# Patient Record
Sex: Female | Born: 1945 | Hispanic: No | Marital: Married | Smoking: Never smoker
Health system: Southern US, Community
[De-identification: ages and names within clinical notes are randomized; demographics above are authoritative.]

## PROBLEM LIST (undated history)

## (undated) DIAGNOSIS — M1710 Unilateral primary osteoarthritis, unspecified knee: Secondary | ICD-10-CM

## (undated) DIAGNOSIS — F419 Anxiety disorder, unspecified: Secondary | ICD-10-CM

## (undated) DIAGNOSIS — K219 Gastro-esophageal reflux disease without esophagitis: Secondary | ICD-10-CM

## (undated) DIAGNOSIS — E785 Hyperlipidemia, unspecified: Secondary | ICD-10-CM

## (undated) HISTORY — DX: Gastro-esophageal reflux disease without esophagitis: K21.9

## (undated) HISTORY — DX: Unilateral primary osteoarthritis, unspecified knee: M17.10

## (undated) HISTORY — DX: Anxiety disorder, unspecified: F41.9

## (undated) HISTORY — DX: Hyperlipidemia, unspecified: E78.5

---

## 2013-02-09 DIAGNOSIS — Z8541 Personal history of malignant neoplasm of cervix uteri: Secondary | ICD-10-CM | POA: Insufficient documentation

## 2013-08-17 DIAGNOSIS — R300 Dysuria: Secondary | ICD-10-CM | POA: Diagnosis not present

## 2013-08-18 DIAGNOSIS — Z79890 Hormone replacement therapy (postmenopausal): Secondary | ICD-10-CM | POA: Insufficient documentation

## 2013-08-18 DIAGNOSIS — Z0000 Encounter for general adult medical examination without abnormal findings: Secondary | ICD-10-CM | POA: Diagnosis not present

## 2013-08-18 DIAGNOSIS — F411 Generalized anxiety disorder: Secondary | ICD-10-CM | POA: Diagnosis not present

## 2013-08-18 DIAGNOSIS — R300 Dysuria: Secondary | ICD-10-CM | POA: Diagnosis not present

## 2014-02-21 DIAGNOSIS — E790 Hyperuricemia without signs of inflammatory arthritis and tophaceous disease: Secondary | ICD-10-CM | POA: Diagnosis not present

## 2014-02-21 DIAGNOSIS — Z79899 Other long term (current) drug therapy: Secondary | ICD-10-CM | POA: Diagnosis not present

## 2014-02-21 DIAGNOSIS — E785 Hyperlipidemia, unspecified: Secondary | ICD-10-CM | POA: Diagnosis not present

## 2014-03-02 DIAGNOSIS — Z79890 Hormone replacement therapy (postmenopausal): Secondary | ICD-10-CM | POA: Diagnosis not present

## 2014-03-02 DIAGNOSIS — Z23 Encounter for immunization: Secondary | ICD-10-CM | POA: Diagnosis not present

## 2014-03-02 DIAGNOSIS — F411 Generalized anxiety disorder: Secondary | ICD-10-CM | POA: Diagnosis not present

## 2014-03-02 DIAGNOSIS — E785 Hyperlipidemia, unspecified: Secondary | ICD-10-CM | POA: Diagnosis not present

## 2014-03-02 DIAGNOSIS — R7301 Impaired fasting glucose: Secondary | ICD-10-CM | POA: Diagnosis not present

## 2014-05-30 DIAGNOSIS — C8588 Other specified types of non-Hodgkin lymphoma, lymph nodes of multiple sites: Secondary | ICD-10-CM | POA: Diagnosis not present

## 2014-05-30 DIAGNOSIS — Z79899 Other long term (current) drug therapy: Secondary | ICD-10-CM | POA: Diagnosis not present

## 2014-05-30 DIAGNOSIS — E785 Hyperlipidemia, unspecified: Secondary | ICD-10-CM | POA: Diagnosis not present

## 2014-05-31 DIAGNOSIS — E785 Hyperlipidemia, unspecified: Secondary | ICD-10-CM | POA: Diagnosis not present

## 2014-05-31 DIAGNOSIS — F411 Generalized anxiety disorder: Secondary | ICD-10-CM | POA: Diagnosis not present

## 2014-05-31 DIAGNOSIS — R7301 Impaired fasting glucose: Secondary | ICD-10-CM | POA: Diagnosis not present

## 2014-09-04 DIAGNOSIS — R7301 Impaired fasting glucose: Secondary | ICD-10-CM | POA: Diagnosis not present

## 2014-09-04 DIAGNOSIS — F411 Generalized anxiety disorder: Secondary | ICD-10-CM | POA: Diagnosis not present

## 2014-09-04 DIAGNOSIS — K219 Gastro-esophageal reflux disease without esophagitis: Secondary | ICD-10-CM | POA: Diagnosis not present

## 2014-09-04 DIAGNOSIS — E559 Vitamin D deficiency, unspecified: Secondary | ICD-10-CM | POA: Diagnosis not present

## 2014-09-04 DIAGNOSIS — E538 Deficiency of other specified B group vitamins: Secondary | ICD-10-CM | POA: Diagnosis not present

## 2014-09-04 DIAGNOSIS — Z79890 Hormone replacement therapy (postmenopausal): Secondary | ICD-10-CM | POA: Diagnosis not present

## 2014-09-04 DIAGNOSIS — E785 Hyperlipidemia, unspecified: Secondary | ICD-10-CM | POA: Diagnosis not present

## 2014-09-04 DIAGNOSIS — Z0000 Encounter for general adult medical examination without abnormal findings: Secondary | ICD-10-CM | POA: Diagnosis not present

## 2014-09-14 DIAGNOSIS — E559 Vitamin D deficiency, unspecified: Secondary | ICD-10-CM | POA: Diagnosis not present

## 2014-09-14 DIAGNOSIS — R7301 Impaired fasting glucose: Secondary | ICD-10-CM | POA: Diagnosis not present

## 2014-09-14 DIAGNOSIS — Z79899 Other long term (current) drug therapy: Secondary | ICD-10-CM | POA: Diagnosis not present

## 2014-09-14 DIAGNOSIS — Z0000 Encounter for general adult medical examination without abnormal findings: Secondary | ICD-10-CM | POA: Diagnosis not present

## 2014-09-14 DIAGNOSIS — E785 Hyperlipidemia, unspecified: Secondary | ICD-10-CM | POA: Diagnosis not present

## 2014-09-14 DIAGNOSIS — E538 Deficiency of other specified B group vitamins: Secondary | ICD-10-CM | POA: Diagnosis not present

## 2014-09-21 DIAGNOSIS — M899 Disorder of bone, unspecified: Secondary | ICD-10-CM | POA: Diagnosis not present

## 2014-09-21 DIAGNOSIS — Z1231 Encounter for screening mammogram for malignant neoplasm of breast: Secondary | ICD-10-CM | POA: Diagnosis not present

## 2014-09-21 DIAGNOSIS — M8588 Other specified disorders of bone density and structure, other site: Secondary | ICD-10-CM | POA: Diagnosis not present

## 2014-09-27 DIAGNOSIS — N6011 Diffuse cystic mastopathy of right breast: Secondary | ICD-10-CM | POA: Diagnosis not present

## 2014-09-27 DIAGNOSIS — R921 Mammographic calcification found on diagnostic imaging of breast: Secondary | ICD-10-CM | POA: Diagnosis not present

## 2014-09-27 DIAGNOSIS — N6001 Solitary cyst of right breast: Secondary | ICD-10-CM | POA: Diagnosis not present

## 2014-12-28 DIAGNOSIS — R300 Dysuria: Secondary | ICD-10-CM | POA: Diagnosis not present

## 2014-12-28 DIAGNOSIS — F419 Anxiety disorder, unspecified: Secondary | ICD-10-CM | POA: Diagnosis not present

## 2014-12-28 DIAGNOSIS — N3001 Acute cystitis with hematuria: Secondary | ICD-10-CM | POA: Diagnosis not present

## 2015-03-07 DIAGNOSIS — E559 Vitamin D deficiency, unspecified: Secondary | ICD-10-CM | POA: Diagnosis not present

## 2015-03-07 DIAGNOSIS — E785 Hyperlipidemia, unspecified: Secondary | ICD-10-CM | POA: Diagnosis not present

## 2015-03-07 DIAGNOSIS — R7301 Impaired fasting glucose: Secondary | ICD-10-CM | POA: Diagnosis not present

## 2015-03-12 DIAGNOSIS — Z79890 Hormone replacement therapy (postmenopausal): Secondary | ICD-10-CM | POA: Diagnosis not present

## 2015-03-12 DIAGNOSIS — E785 Hyperlipidemia, unspecified: Secondary | ICD-10-CM | POA: Diagnosis not present

## 2015-03-12 DIAGNOSIS — F419 Anxiety disorder, unspecified: Secondary | ICD-10-CM | POA: Diagnosis not present

## 2015-03-12 DIAGNOSIS — R7301 Impaired fasting glucose: Secondary | ICD-10-CM | POA: Diagnosis not present

## 2015-03-12 DIAGNOSIS — Z23 Encounter for immunization: Secondary | ICD-10-CM | POA: Diagnosis not present

## 2015-03-12 DIAGNOSIS — K219 Gastro-esophageal reflux disease without esophagitis: Secondary | ICD-10-CM | POA: Diagnosis not present

## 2015-04-03 DIAGNOSIS — R928 Other abnormal and inconclusive findings on diagnostic imaging of breast: Secondary | ICD-10-CM | POA: Diagnosis not present

## 2015-04-03 DIAGNOSIS — N6001 Solitary cyst of right breast: Secondary | ICD-10-CM | POA: Diagnosis not present

## 2015-06-13 ENCOUNTER — Ambulatory Visit (INDEPENDENT_AMBULATORY_CARE_PROVIDER_SITE_OTHER): Payer: Medicare Other | Admitting: Family Medicine

## 2015-06-13 ENCOUNTER — Ambulatory Visit (INDEPENDENT_AMBULATORY_CARE_PROVIDER_SITE_OTHER): Payer: Medicare Other

## 2015-06-13 VITALS — BP 133/69 | HR 74 | Wt 166.0 lb

## 2015-06-13 DIAGNOSIS — E785 Hyperlipidemia, unspecified: Secondary | ICD-10-CM | POA: Insufficient documentation

## 2015-06-13 DIAGNOSIS — K219 Gastro-esophageal reflux disease without esophagitis: Secondary | ICD-10-CM

## 2015-06-13 DIAGNOSIS — M109 Gout, unspecified: Secondary | ICD-10-CM | POA: Insufficient documentation

## 2015-06-13 DIAGNOSIS — M25562 Pain in left knee: Secondary | ICD-10-CM | POA: Diagnosis not present

## 2015-06-13 DIAGNOSIS — M25561 Pain in right knee: Secondary | ICD-10-CM

## 2015-06-13 DIAGNOSIS — F419 Anxiety disorder, unspecified: Secondary | ICD-10-CM

## 2015-06-13 DIAGNOSIS — K589 Irritable bowel syndrome without diarrhea: Secondary | ICD-10-CM | POA: Insufficient documentation

## 2015-06-13 DIAGNOSIS — M1711 Unilateral primary osteoarthritis, right knee: Secondary | ICD-10-CM | POA: Diagnosis not present

## 2015-06-13 HISTORY — DX: Gastro-esophageal reflux disease without esophagitis: K21.9

## 2015-06-13 HISTORY — DX: Anxiety disorder, unspecified: F41.9

## 2015-06-13 HISTORY — DX: Hyperlipidemia, unspecified: E78.5

## 2015-06-13 NOTE — Progress Notes (Signed)
Amy Boyer is a 70 y.o. female who presents to [facility] today for knee pain. Patient is a multiple month history of right knee pain worsening in the past 6 weeks or so. She denies any specific injury. She does note some knee swelling. Pain is moderate and worsening now with activities. She has tried Tylenol ice rest and elevation which helped only a little. She denies any fevers chills nausea vomiting or diarrhea. She feels well otherwise. No other specific treatment tried yet.   No past medical history on file. No past surgical history on file. Social History  Substance Use Topics  . Smoking status: Not on file  . Smokeless tobacco: Not on file  . Alcohol Use: Not on file   family history is not on file.  ROS:  No headache, visual changes, nausea, vomiting, diarrhea, constipation, dizziness, abdominal pain, skin rash, fevers, chills, night sweats, weight loss, swollen lymph nodes, body aches, joint swelling, muscle aches, chest pain, shortness of breath, mood changes, visual or auditory hallucinations.    Medications: Current Outpatient Prescriptions  Medication Sig Dispense Refill  . ALPRAZolam (XANAX) 0.5 MG tablet   0  . Cholecalciferol (VITAMIN D-3 PO) Take by mouth.    . estradiol (ESTRACE) 0.5 MG tablet Take 0.5 mg by mouth daily.  0  . Omeprazole (PRILOSEC PO) Take by mouth.     No current facility-administered medications for this visit.   Allergies  Allergen Reactions  . Keflex [Cephalexin]   . Morphine And Related      Exam:  BP 133/69 mmHg  Pulse 74  Wt 166 lb (75.297 kg) General: Well Developed, well nourished, and in no acute distress.  Neuro/Psych: Alert and oriented x3, extra-ocular muscles intact, able to move all 4 extremities, sensation grossly intact. Skin: Warm and dry, no rashes noted.  Respiratory: Not using accessory muscles, speaking in full sentences, trachea midline.  Cardiovascular: Pulses  palpable, no extremity edema. Abdomen: Does not appear distended. MSK: Right knee moderate effusion otherwise normal-appearing Range of motion 0-120. Tender to palpation medial and lateral joint lines. Stable anterior and posterior drawer testing. Mildly positive medial and lateral McMurray's testing. Stable valgus and varus stress testing. Patient walks with an antalgic gait.  Procedure: Real-time Ultrasound Guided Injection of Right Knee  Device: GE Logiq E  Images permanently stored and available for review in the ultrasound unit. Verbal informed consent obtained. Discussed risks and benefits of procedure. Warned about infection bleeding damage to structures skin hypopigmentation and fat atrophy among others. Patient expresses understanding and agreement Time-out conducted.  Noted no overlying erythema, induration, or other signs of local infection.  Skin prepped in a sterile fashion.  Local anesthesia: Topical Ethyl chloride.  With sterile technique and under real time ultrasound guidance: 80mg  of Kenalog and 4 mL of Marcaine injected easily.  Completed without difficulty  Pain immediately resolved suggesting accurate placement of the medication.  Advised to call if fevers/chills, erythema, induration, drainage, or persistent bleeding.  Images permanently stored and available for review in the ultrasound unit.  Impression: Technically successful ultrasound guided injection.     No results found for this or any previous visit (from the past 24 hour(s)). Dg Knee 1-2 Views Left  06/13/2015  CLINICAL DATA:  Right knee pain for 6 weeks. EXAM: LEFT KNEE - 1-2 VIEW COMPARISON:  No comparison studies available. FINDINGS: Four views study of the right knee shows no fracture. No subluxation or dislocation. Mild spurring is seen  in the patellofemoral compartment. No evidence of joint effusion. IMPRESSION: No acute bony findings. Electronically Signed   By: Misty Stanley M.D.   On:  06/13/2015 15:23   Dg Knee Complete 4 Views Right  06/13/2015  CLINICAL DATA:  Right knee pain for 6 weeks, no known injury EXAM: RIGHT KNEE - COMPLETE 4+ VIEW COMPARISON:  Left knee FINDINGS: Four views of the right knee submitted. No acute fracture or subluxation. There is mild narrowing of medial joint compartment. Mild narrowing of patellofemoral joint space. No joint effusion. No significant pathologic calcifications. IMPRESSION: No acute fracture or subluxation. Mild degenerative changes as described above. Electronically Signed   By: Lahoma Crocker M.D.   On: 06/13/2015 15:32     Please see individual assessment and plan sections.  CC: Martinique, Cleta Alberts, NP

## 2015-06-13 NOTE — Patient Instructions (Signed)
Thank you for coming in today. Call or go to the ER if you develop a large red swollen joint with extreme pain or oozing puss.  Return in 3-4 weeks.  Return sooner if needed.   Arthritis Arthritis is a term that is commonly used to refer to joint pain or joint disease. There are more than 100 types of arthritis. CAUSES The most common cause of this condition is wear and tear of a joint. Other causes include:  Gout.  Inflammation of a joint.  An infection of a joint.  Sprains and other injuries near the joint.  A drug reaction or allergic reaction. In some cases, the cause may not be known. SYMPTOMS The main symptom of this condition is pain in the joint with movement. Other symptoms include:  Redness, swelling, or stiffness at a joint.  Warmth coming from the joint.  Fever.  Overall feeling of illness. DIAGNOSIS This condition may be diagnosed with a physical exam and tests, including:  Blood tests.  Urine tests.  Imaging tests, such as MRI, X-rays, or a CT scan. Sometimes, fluid is removed from a joint for testing. TREATMENT Treatment for this condition may involve:  Treatment of the cause, if it is known.  Rest.  Raising (elevating) the joint.  Applying cold or hot packs to the joint.  Medicines to improve symptoms and reduce inflammation.  Injections of a steroid such as cortisone into the joint to help reduce pain and inflammation. Depending on the cause of your arthritis, you may need to make lifestyle changes to reduce stress on your joint. These changes may include exercising more and losing weight. HOME CARE INSTRUCTIONS Medicines  Take over-the-counter and prescription medicines only as told by your health care provider.  Do not take aspirin to relieve pain if gout is suspected. Activities  Rest your joint if told by your health care provider. Rest is important when your disease is active and your joint feels painful, swollen, or stiff.  Avoid  activities that make the pain worse. It is important to balance activity with rest.  Exercise your joint regularly with range-of-motion exercises as told by your health care provider. Try doing low-impact exercise, such as:  Swimming.  Water aerobics.  Biking.  Walking. Joint Care  If your joint is swollen, keep it elevated if told by your health care provider.  If your joint feels stiff in the morning, try taking a warm shower.  If directed, apply heat to the joint. If you have diabetes, do not apply heat without permission from your health care provider.  Put a towel between the joint and the hot pack or heating pad.  Leave the heat on the area for 20-30 minutes.  If directed, apply ice to the joint:  Put ice in a plastic bag.  Place a towel between your skin and the bag.  Leave the ice on for 20 minutes, 2-3 times per day.  Keep all follow-up visits as told by your health care provider. This is important. SEEK MEDICAL CARE IF:  The pain gets worse.  You have a fever. SEEK IMMEDIATE MEDICAL CARE IF:  You develop severe joint pain, swelling, or redness.  Many joints become painful and swollen.  You develop severe back pain.  You develop severe weakness in your leg.  You cannot control your bladder or bowels.   This information is not intended to replace advice given to you by your health care provider. Make sure you discuss any questions you have  with your health care provider.   Document Released: 05/08/2004 Document Revised: 12/20/2014 Document Reviewed: 06/26/2014 Elsevier Interactive Patient Education [employer].

## 2015-06-14 NOTE — Progress Notes (Signed)
Routed progress note through EPIC to PCP, Julie Martinique

## 2015-06-14 NOTE — Assessment & Plan Note (Signed)
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 Plan for injection today. Recheck in a few weeks. If not improved would consider MRI versus trial of viscosupplementation.

## 2015-06-14 NOTE — Progress Notes (Signed)
Quick Note:  Xray shows mild arthritis ______ 

## 2015-07-04 ENCOUNTER — Ambulatory Visit (INDEPENDENT_AMBULATORY_CARE_PROVIDER_SITE_OTHER): Payer: Medicare Other | Admitting: Family Medicine

## 2015-07-04 VITALS — BP 144/80 | HR 77 | Wt 166.0 lb

## 2015-07-04 DIAGNOSIS — M25561 Pain in right knee: Secondary | ICD-10-CM | POA: Diagnosis not present

## 2015-07-04 MED ORDER — DICLOFENAC SODIUM 1 % TD GEL: 4.0000 g | Freq: Four times a day (QID) | TRANSDERMAL | Status: DC

## 2015-07-04 NOTE — Patient Instructions (Signed)
Thank you for coming in today. Return as needed.   Diclofenac skin gel What is this medicine? DICLOFENAC (dye KLOE fen ak) is a non-steroidal anti-inflammatory drug (NSAID). The 1% skin gel is used to treat osteoarthritis of the hands or knees. The 3% skin gel is used to treat actinic keratosis. This medicine may be used for other purposes; ask your health care provider or pharmacist if you have questions. What should I tell my health care provider before I take this medicine? They need to know if you have any of these conditions: -asthma -bleeding problems -coronary artery bypass graft (CABG) surgery within the past 2 weeks -heart disease -high blood pressure -if you frequently drink alcohol containing drinks -kidney disease -liver disease -open or infected skin -stomach problems -an unusual or allergic reaction to diclofenac, aspirin, other NSAIDs, other medicines, benzyl alcohol (3% gel only), foods, dyes, or preservatives -pregnant or trying to get pregnant -breast-feeding How should I use this medicine? This medicine is for external use only. Follow the directions on the prescription label. Wash hands before and after use. Do not get this medicine in your eyes. If you do, rinse out with plenty of cool tap water. Use your doses at regular intervals. Do not use your medicine more often than directed. A special MedGuide will be given to you by the pharmacist with each prescription and refill of the 1% gel. Be sure to read this information carefully each time. Talk to your pediatrician regarding the use of this medicine in children. Special care may be needed. The 3% gel is not approved for use in children. Overdosage: If you think you have taken too much of this medicine contact a poison control center or emergency room at once. NOTE: This medicine is only for you. Do not share this medicine with others. What if I miss a dose? If you miss a dose, use it as soon as you can. If it is  almost time for your next dose, use only that dose. Do not use double or extra doses. What may interact with this medicine? -aspirin -NSAIDs, medicines for pain and inflammation, like ibuprofen or naproxen Do not use any other skin products without telling your doctor or health care professional. This list may not describe all possible interactions. Give your health care provider a list of all the medicines, herbs, non-prescription drugs, or dietary supplements you use. Also tell them if you smoke, drink alcohol, or use illegal drugs. Some items may interact with your medicine. What should I watch for while using this medicine? Tell your doctor or healthcare professional if your symptoms do not start to get better or if they get worse. You will need to follow up with your health care provider to monitor your progress. You may need to be treated for up to 3 months if you are using the 3% gel, but the full effect may not occur until 1 month after stopping treatment. If you develop a severe skin reaction, contact your doctor or health care professional immediately. This medicine can make you more sensitive to the sun. Keep out of the sun. If you cannot avoid being in the sun, wear protective clothing and use sunscreen. Do not use sun lamps or tanning beds/booths. Do not take medicines such as ibuprofen and naproxen with this medicine. Side effects such as stomach upset, nausea, or ulcers may be more likely to occur. Many medicines available without a prescription should not be taken with this medicine. This medicine does not  prevent heart attack or stroke. In fact, this medicine may increase the chance of a heart attack or stroke. The chance may increase with longer use of this medicine and in people who have heart disease. If you take aspirin to prevent heart attack or stroke, talk with your doctor or health care professional. This medicine can cause ulcers and bleeding in the stomach and intestines at any  time during treatment. Do not smoke cigarettes or drink alcohol. These increase irritation to your stomach and can make it more susceptible to damage from this medicine. Ulcers and bleeding can happen without warning symptoms and can cause death. You may get drowsy or dizzy. Do not drive, use machinery, or do anything that needs mental alertness until you know how this medicine affects you. Do not stand or sit up quickly, especially if you are an older patient. This reduces the risk of dizzy or fainting spells. This medicine can cause you to bleed more easily. Try to avoid damage to your teeth and gums when you brush or floss your teeth. What side effects may I notice from receiving this medicine? Side effects that you should report to your doctor or health care professional as soon as possible: -allergic reactions like skin rash, itching or hives, swelling of the face, lips, or tongue -black or bloody stools, blood in the urine or vomit -blurred vision -chest pain -difficulty breathing or wheezing -nausea or vomiting -redness, blistering, peeling or loosening of the skin, including inside the mouth -slurred speech or weakness on one side of the body -trouble passing urine or change in the amount of urine -unexplained weight gain or swelling -unusually weak or tired -yellowing of eyes or skin Side effects that usually do not require medical attention (report to your doctor or health care professional if they continue or are bothersome): -dizziness -dry skin -headache -heartburn -increased sensitivity to the sun -stomach pain -tingling at the application site This list may not describe all possible side effects. Call your doctor for medical advice about side effects. You may report side effects to FDA at 1-800-FDA-1088. Where should I keep my medicine? Keep out of the reach of children. Store the 1% gel at room temperature between 15 and 30 degrees C (59 and 86 degrees F). Store the 3% gel  at room temperature between 20 and 25 degrees C (68 and 77 degrees F). Protect from light. Throw away any unused medicine after the expiration date. NOTE: This sheet is a summary. It may not cover all possible information. If you have questions about this medicine, talk to your doctor, pharmacist, or health care provider.    2016, Elsevier/Gold Standard. (2007-08-02 16:35:07)

## 2015-07-04 NOTE — Assessment & Plan Note (Addendum)
Probable degenerative meniscus tear. Discussed options. Patient seems to be doing pretty well. Trial of topical diclofenac gel. Return as needed. If not better would consider MRI to evaluate meniscus injury.

## 2015-07-04 NOTE — Progress Notes (Signed)
   Amy Boyer is a 70 y.o. female who presents to [facility] today for follow up right knee pain and swelling. Symptoms present for months. Amy Boyer was seen on 06/13/15.  X-ray showed mild right knee DJD. She received a interarticular steroid injection and had significant improvement in symptoms. She continues to experience some pain and swelling but overall is improved. She notes she'll have worsening swelling and pain when she kneels or rotates her knee the wrong way. She denies any locking or catching.   No past medical history on file. No past surgical history on file. Social History  Substance Use Topics  . Smoking status: Not on file  . Smokeless tobacco: Not on file  . Alcohol Use: Not on file   family history is not on file.  ROS:  No headache, visual changes, nausea, vomiting, diarrhea, constipation, dizziness, abdominal pain, skin rash, fevers, chills, night sweats, weight loss, swollen lymph nodes, body aches, joint swelling, muscle aches, chest pain, shortness of breath, mood changes, visual or auditory hallucinations.    Medications: Current Outpatient Prescriptions  Medication Sig Dispense Refill  . ALPRAZolam (XANAX) 0.5 MG tablet   0  . Cholecalciferol (VITAMIN D-3 PO) Take by mouth.    . estradiol (ESTRACE) 0.5 MG tablet Take 0.5 mg by mouth daily.  0  . Omeprazole (PRILOSEC PO) Take by mouth.    . diclofenac sodium (VOLTAREN) 1 % GEL Apply 4 g topically 4 (four) times daily. To affected joint. 100 g 11   No current facility-administered medications for this visit.   Allergies  Allergen Reactions  . Keflex [Cephalexin]   . Morphine And Related      Exam:  BP 144/80 mmHg  Pulse 77  Wt 166 lb (75.297 kg) General: Well Developed, well nourished, and in no acute distress.  Neuro/Psych: Alert and oriented x3, extra-ocular muscles intact, able to move all 4 extremities, sensation grossly intact. Skin: Warm and dry,  no rashes noted.  Respiratory: Not using accessory muscles, speaking in full sentences, trachea midline.  Cardiovascular: Pulses palpable, no extremity edema. Abdomen: Does not appear distended. MSK: Right knee: Normal motion. Normal gait.   No results found for this or any previous visit (from the past 24 hour(s)). No results found.   Please see individual assessment and plan sections.

## 2015-08-09 ENCOUNTER — Encounter: Payer: Self-pay | Admitting: Family Medicine

## 2015-08-09 ENCOUNTER — Ambulatory Visit (INDEPENDENT_AMBULATORY_CARE_PROVIDER_SITE_OTHER): Payer: Medicare Other | Admitting: Family Medicine

## 2015-08-09 VITALS — BP 142/86 | HR 77 | Wt 167.0 lb

## 2015-08-09 DIAGNOSIS — M25561 Pain in right knee: Secondary | ICD-10-CM | POA: Diagnosis not present

## 2015-08-09 NOTE — Progress Notes (Signed)
       Amy Boyer is a 70 y.o. female who presents to [facility] today for follow-up knee pain. Patient notes continued right knee pain since the last visit. She notes the injection helped for a few weeks but then pain return. She's tried topical anti-inflammatory diclofenac which has not helped. She tried home exercise therapy programs which have not helped. Patient is mostly medial and lateral. She denies any locking or catching.   No past medical history on file. No past surgical history on file. Social History  Substance Use Topics  . Smoking status: Never Smoker   . Smokeless tobacco: Not on file  . Alcohol Use: Not on file   family history is not on file.  ROS as above Medications: Current Outpatient Prescriptions  Medication Sig Dispense Refill  . ALPRAZolam (XANAX) 0.5 MG tablet   0  . Cholecalciferol (VITAMIN D-3 PO) Take by mouth.    . diclofenac sodium (VOLTAREN) 1 % GEL Apply 4 g topically 4 (four) times daily. To affected joint. 100 g 11  . estradiol (ESTRACE) 0.5 MG tablet Take 0.5 mg by mouth daily.  0  . Omeprazole (PRILOSEC PO) Take by mouth.     No current facility-administered medications for this visit.   Allergies  Allergen Reactions  . Keflex [Cephalexin]   . Morphine And Related      Exam:  BP 142/86 mmHg  Pulse 77  Wt 167 lb (75.751 kg) Gen: Well NAD Right knee: Normal-appearing tender palpation. Normal motion Stable ligamentous exam Normal gait.  Treatment right knee reviewed Significant for mild DJD  No results found for this or any previous visit (from the past 24 hour(s)). No results found.   70 year old woman with right knee pain. Patient has pretty bothersome knee pain. Pain is out of proportion to her x-ray that shows mild DJD. I'm concerned that she has a meniscus injury. She has failed to improve with injection and trial of therapy  plan for MRI for surgical planning.

## 2015-08-09 NOTE — Patient Instructions (Signed)
Thank you for coming in today. You should be contacted tomorrow about the MRI time but there are openings Monday.   Return next week to go over results.   Magnetic Resonance Imaging Magnetic resonance imaging (MRI) is an imaging test that produces clear digital pictures of the inside of your body without using X-rays. The MRI scanner uses radio waves and a magnetic field to create the images. The MRI pictures may provide different details than images obtained through X-rays, CT scans, or ultrasounds. Contrast material may be injected to make MRI images even more clear. In a standard MRI scanner, the area of your body being studied will be in the center opening of the scanner. In open MRI scanners, the scanner does not entirely surround your body.  LET [facility] CARE PROVIDER KNOW ABOUT:  Previous surgeries you have had.  Any metal you may have in your body. The magnet used in MRI can cause metal objects in your body to move. This includes:  A pacemaker or any other implants, such as an implanted neurostimulator, a metallic ear implant, or a metallic object within the eye socket.  Metal splinters in your body.  Any bullet fragments.  A port for delivering insulin or chemotherapy.  Any tattoos. Some red dyes contain iron which is sometimes a problem.  If you are pregnant or think you may be pregnant.  If you are breastfeeding.  If you are afraid of cramped spaces (claustrophobic). If claustrophobia is a problem, it usually can be relieved with medicines or the use of the open MRI scanner.  Any allergies you have.  All medicines you are taking, including vitamins, herbs, eye drops, creams, and over-the-counter medicines. RISKS AND COMPLICATIONS  Generally, MRI is a safe procedure. However, problems can occur and include:  If a metal implant is present but is undetected, it may be affected by the strong magnetic field. In addition, if the implant is close to the examination site, it  may be hard to get high-quality images.  If you are pregnant:  MRI generally should be avoided during the first three months of pregnancy. It is not known what effects the MRI may have on a fetus. Ultrasound is preferred at this time unless a serious condition is suspected that is best studied by MRI. MRI should be considered if there is a substantial risk of missing the correct diagnosis if MRI is not done.  If you are breastfeeding:  You should inform your health care provider and ask how to proceed. You may pump breast milk before the exam for use until the contrast material, if used, has cleared from the body. BEFORE THE PROCEDURE   You will be asked to remove all metal, including:  Your watch, jewelry, and other metal objects.  Some makeup also contains traces of metal and may need to be removed.  Braces and fillings normally are not a problem. PROCEDURE  You may be given earplugs or headphones to listen to music. The MRI scanner can be noisy.  You may be injected with contrast material.  The standard MRI is done in a long, magnetic chamber. You will lie down on a platform that slides into the magnetic chamber. Once inside, you will still be able to talk to the person performing the test. The open MRI scanner is open on at least one side of the scanner.  You will be asked to hold very still. You will be told when you can shift position. You may have to  wait a few minutes to make sure the images are readable. AFTER THE PROCEDURE   You may resume normal activities right away.  If you were given contrast material, it will pass naturally through your body within a day.  A person experienced in MRI (radiologist) will analyze the results and send a report to your health care provider, along with an explanation of the results.   This information is not intended to replace advice given to you by your health care provider. Make sure you discuss any questions you have with your health  care provider.   Document Released: 03/28/2000 Document Revised: 04/21/2014 Document Reviewed: 05/26/2013 Elsevier Interactive Patient Education [employer].

## 2015-08-13 ENCOUNTER — Ambulatory Visit (INDEPENDENT_AMBULATORY_CARE_PROVIDER_SITE_OTHER): Payer: Medicare Other

## 2015-08-13 DIAGNOSIS — M659 Synovitis and tenosynovitis, unspecified: Secondary | ICD-10-CM | POA: Diagnosis not present

## 2015-08-13 DIAGNOSIS — M1711 Unilateral primary osteoarthritis, right knee: Secondary | ICD-10-CM | POA: Diagnosis not present

## 2015-08-13 DIAGNOSIS — R600 Localized edema: Secondary | ICD-10-CM | POA: Diagnosis not present

## 2015-08-13 DIAGNOSIS — M25561 Pain in right knee: Secondary | ICD-10-CM | POA: Diagnosis not present

## 2015-08-13 DIAGNOSIS — M25461 Effusion, right knee: Secondary | ICD-10-CM | POA: Diagnosis not present

## 2015-08-13 DIAGNOSIS — S83203A Other tear of unspecified meniscus, current injury, right knee, initial encounter: Secondary | ICD-10-CM | POA: Diagnosis not present

## 2015-08-13 DIAGNOSIS — X58XXXA Exposure to other specified factors, initial encounter: Secondary | ICD-10-CM

## 2015-08-16 ENCOUNTER — Encounter: Payer: Self-pay | Admitting: Family Medicine

## 2015-08-16 ENCOUNTER — Ambulatory Visit (INDEPENDENT_AMBULATORY_CARE_PROVIDER_SITE_OTHER): Payer: Medicare Other | Admitting: Family Medicine

## 2015-08-16 VITALS — BP 134/73 | HR 81 | Wt 165.0 lb

## 2015-08-16 DIAGNOSIS — S83241A Other tear of medial meniscus, current injury, right knee, initial encounter: Secondary | ICD-10-CM | POA: Diagnosis not present

## 2015-08-16 NOTE — Assessment & Plan Note (Signed)
I believe the majority of the patient's pain is due to the meniscus tear. She has failed conservative management and will refer to speak surgery for evaluation and possible surgery. I believe she would benefit from arthroscopic partial medial meniscectomy and possible chondroplasty.

## 2015-08-16 NOTE — Patient Instructions (Signed)
Thank you for coming in today. You should hear from Dr Rockey Situ office soon.  Return as needed.  Go downstairs and pick up your CD with the MRI on it for Dr Pill.   Meniscus Injury, Arthroscopy Arthroscopy is a surgical procedure that involves the use of a small scope that has a camera and surgical instruments on the end (arthroscope). An arthroscope can be used to repair your meniscus injury.  LET [facility] CARE PROVIDER KNOW ABOUT:  Any allergies you have.  All medicines you are taking, including vitamins, herbs, eyedrops, creams, and over-the-counter medicines.  Any recent colds or infections you have had or currently have.  Previous problems you or members of your family have had with the use of anesthetics.  Any blood disorders or blood clotting problems you have.  Previous surgeries you have had.  Medical conditions you have. RISKS AND COMPLICATIONS Generally, this is a safe procedure. However, as with any procedure, problems can occur. Possible problems include:  Damage to nerves or blood vessels.  Excess bleeding.  Blood clots.  Infection. BEFORE THE PROCEDURE  Do not eat or drink for 6-8 hours before the procedure.  Take medicines as directed by your surgeon. Ask your surgeon about changing or stopping your regular medicines.  You may have lab tests the morning of surgery. PROCEDURE  You will be given one of the following:   A medicine that numbs the area (local anesthesia).  A medicine that makes you go to sleep (general anesthesia).  A medicine injected into your spine that numbs your body below the waist (spinal anesthesia). Most often, several small cuts (incisions) are made in the knee. The arthroscope and instruments go into the incisions to repair the damage. The torn portion of the meniscus is removed.  During this time, your surgeon may find a partial or complete tear in a cruciate ligament, such as the anterior cruciate ligament (ACL). A completely  torn cruciate ligament is reconstructed by taking tissue from another part of the body (grafting) and placing it into the injured area. This requires several larger incisions to complete the repair. Sometimes, open surgery is needed for collateral ligament injuries. If a collateral ligament is found to be injured, your surgeon may staple or suture the tear through a slightly larger incision on the side of the knee. AFTER THE PROCEDURE You will be taken to the recovery area where your progress will be monitored. When you are awake, stable, and taking fluids without complications, you will be allowed to go home. This is usually the same day. However, more extensive repairs of a ligament may require an overnight stay.  The recovery time after repairing your meniscus or ligament depends on the amount of damage to these structures. It also depends on whether or not reconstructive knee surgery was needed.   A torn or stretched ligament (ligament sprain) may take 6-8 weeks to heal. It takes about the same amount of time if your surgeon removed a torn meniscus.  A repaired meniscus may require 6-12 weeks of recovery time.  A torn ligament needing reconstructive surgery may take 6-12 months to heal fully.   This information is not intended to replace advice given to you by your health care provider. Make sure you discuss any questions you have with your health care provider.   Document Released: 03/28/2000 Document Revised: 04/05/2013 Document Reviewed: 08/27/2012 Elsevier Interactive Patient Education [employer].

## 2015-08-16 NOTE — Progress Notes (Signed)
Amy Boyer is a 70 y.o. female who presents to [facility] today for follow-up knee pain. Patient has a history of ongoing knee pain. She had a recent MRI which showed DJD however a large medial meniscus tear. She has had a trial of injection which did not help. The pain is limiting her ability to function and perform normal activities of daily living and is adversely affecting her quality of life.   No past medical history on file. No past surgical history on file. Social History  Substance Use Topics  . Smoking status: Never Smoker   . Smokeless tobacco: Not on file  . Alcohol Use: Not on file   family history is not on file.  ROS as above Medications: Current Outpatient Prescriptions  Medication Sig Dispense Refill  . ALPRAZolam (XANAX) 0.5 MG tablet   0  . Cholecalciferol (VITAMIN D-3 PO) Take by mouth.    . diclofenac sodium (VOLTAREN) 1 % GEL Apply 4 g topically 4 (four) times daily. To affected joint. 100 g 11  . estradiol (ESTRACE) 0.5 MG tablet Take 0.5 mg by mouth daily.  0  . Omeprazole (PRILOSEC PO) Take by mouth.     No current facility-administered medications for this visit.   Allergies  Allergen Reactions  . Keflex [Cephalexin]   . Morphine And Related      Exam:  BP 134/73 mmHg  Pulse 81  Wt 165 lb (74.844 kg) Gen: Well NAD Right knee normal-appearing. Mildly tender medial joint line. Normal motion.  MRI right knee CLINICAL DATA: Right knee swelling and popping with pain. Worsening over the past 2 months.  EXAM: MRI OF THE RIGHT KNEE WITHOUT CONTRAST  TECHNIQUE: Multiplanar, multisequence MR imaging of the knee was performed. No intravenous contrast was administered.  COMPARISON: 06/13/2015  FINDINGS: MENISCI  Medial meniscus: There is a large radial tear of the posterior horn about 1.2 cm medial to the meniscal root. Prominent  adjacent grade 1 signal in the remainder of the posterior horn extending into the midbody. Horizontal tear of the inferior surface extends medial to the radial tear.  Lateral meniscus: Unremarkable  LIGAMENTS  Cruciates: Unremarkable  Collaterals: Edema tracks around the MCL.  CARTILAGE  Patellofemoral: Prominent chondral thinning along portions of the medial patellar facet. Moderate chondral thinning elsewhere. Small degenerative subcortical lesions superiorly along the medial facet.  Medial: Severe degenerative chondral thinning with marginal spurring. Subcortical marrow edema especially in the medial femoral condyle, along a sagittally oriented osteochondral lesion which has some mild cortical notching but no overt fragmentation.  Lateral: Moderate chondral thinning.  Joint: Large knee effusion with mild synovitis.  Popliteal Fossa: Unremarkable  Extensor Mechanism: Edema tracks around the margins of the vastus medialis distally and along the adjacent posterior portion the medial patellar retinaculum and medial patellofemoral ligament. The retinaculum and medial patellofemoral ligament may be sprained.  Bones: No significant extra-articular osseous abnormalities identified.  IMPRESSION: 1. Complex tear posterior horn medial meniscus, primary component is a large radial tear, but with a horizontal tear extending along the inferior meniscal surface medial to this radial tear. 2. Prominent osteoarthritis most severe along the medial patellar facet and in the medial femoral condyle as detailed above. 3. Large knee effusion with mild synovitis. 4. Edema tracks around the distal vastus medialis, the MCL, the medial patellofemoral ligament, and the medial patellar retinaculum. This may reflect strain/sprain involving the structures, which do not appear overtly torn.   Electronically  Signed  By: Van Clines M.D.  On: 08/13/2015 12:03  No  results found for this or any previous visit (from the past 24 hour(s)). No results found.   Please see individual assessment and plan sections.

## 2015-08-27 DIAGNOSIS — M25561 Pain in right knee: Secondary | ICD-10-CM | POA: Diagnosis not present

## 2015-08-27 DIAGNOSIS — S8391XA Sprain of unspecified site of right knee, initial encounter: Secondary | ICD-10-CM | POA: Diagnosis not present

## 2015-11-06 DIAGNOSIS — R7301 Impaired fasting glucose: Secondary | ICD-10-CM | POA: Diagnosis not present

## 2015-11-06 DIAGNOSIS — E785 Hyperlipidemia, unspecified: Secondary | ICD-10-CM | POA: Diagnosis not present

## 2015-11-06 DIAGNOSIS — K219 Gastro-esophageal reflux disease without esophagitis: Secondary | ICD-10-CM | POA: Diagnosis not present

## 2015-11-06 DIAGNOSIS — F419 Anxiety disorder, unspecified: Secondary | ICD-10-CM | POA: Diagnosis not present

## 2016-01-09 DIAGNOSIS — N63 Unspecified lump in breast: Secondary | ICD-10-CM | POA: Diagnosis not present

## 2016-01-09 DIAGNOSIS — Z09 Encounter for follow-up examination after completed treatment for conditions other than malignant neoplasm: Secondary | ICD-10-CM | POA: Diagnosis not present

## 2016-01-09 DIAGNOSIS — R922 Inconclusive mammogram: Secondary | ICD-10-CM | POA: Diagnosis not present

## 2016-01-09 DIAGNOSIS — R928 Other abnormal and inconclusive findings on diagnostic imaging of breast: Secondary | ICD-10-CM | POA: Diagnosis not present

## 2016-03-20 DIAGNOSIS — Z23 Encounter for immunization: Secondary | ICD-10-CM | POA: Diagnosis not present

## 2016-03-20 DIAGNOSIS — Z0000 Encounter for general adult medical examination without abnormal findings: Secondary | ICD-10-CM | POA: Diagnosis not present

## 2016-03-20 DIAGNOSIS — R7301 Impaired fasting glucose: Secondary | ICD-10-CM | POA: Diagnosis not present

## 2016-03-20 DIAGNOSIS — E785 Hyperlipidemia, unspecified: Secondary | ICD-10-CM | POA: Diagnosis not present

## 2016-03-20 DIAGNOSIS — Z79890 Hormone replacement therapy (postmenopausal): Secondary | ICD-10-CM | POA: Diagnosis not present

## 2016-03-20 DIAGNOSIS — F419 Anxiety disorder, unspecified: Secondary | ICD-10-CM | POA: Diagnosis not present

## 2016-03-20 DIAGNOSIS — K219 Gastro-esophageal reflux disease without esophagitis: Secondary | ICD-10-CM | POA: Diagnosis not present

## 2016-03-20 DIAGNOSIS — M545 Low back pain: Secondary | ICD-10-CM | POA: Diagnosis not present

## 2016-04-02 DIAGNOSIS — S8391XA Sprain of unspecified site of right knee, initial encounter: Secondary | ICD-10-CM | POA: Diagnosis not present

## 2016-04-02 DIAGNOSIS — M25561 Pain in right knee: Secondary | ICD-10-CM | POA: Diagnosis not present

## 2016-07-22 IMAGING — MR MR KNEE*R* W/O CM
6 series · 36 of 40 positions shown · non-contrast
Comparison: 06/13/2015

CLINICAL DATA: Right knee swelling and popping with pain. Worsening
over the past 2 months.

EXAM:
MRI OF THE RIGHT KNEE WITHOUT CONTRAST
TECHNIQUE: Multiplanar, multisequence MR imaging of the knee was performed. No
intravenous contrast was administered.

[Series 3: PD fat-sat · axial · 3.0mm · 0.31mm/px · z∈[-72,+40]mm · 9 of 35 slices shown (1 of 4)]
[im 1/35]
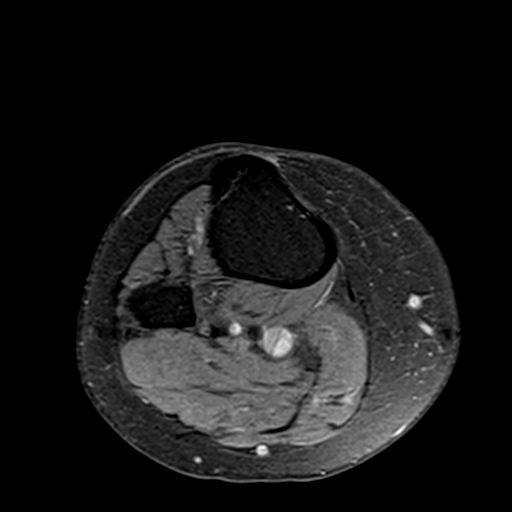
[im 5/35]
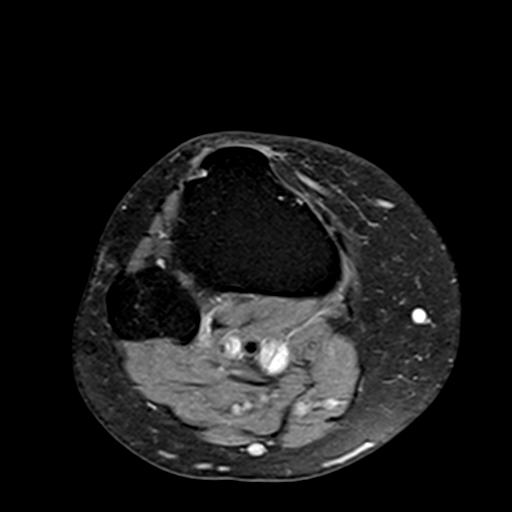
[im 9/35]
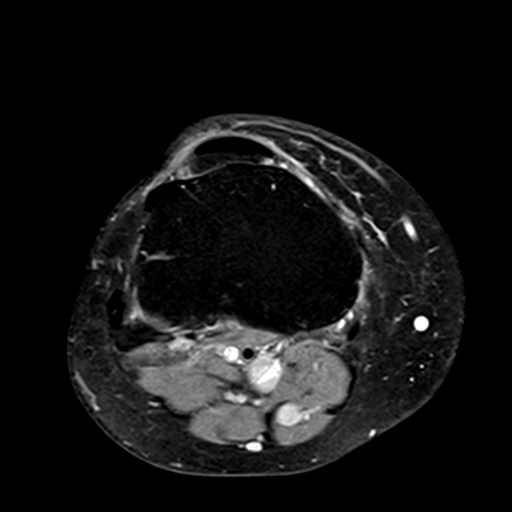
[im 13/35]
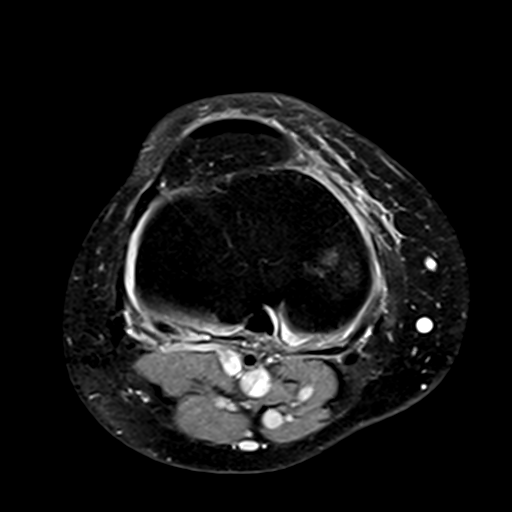
[im 18/35]
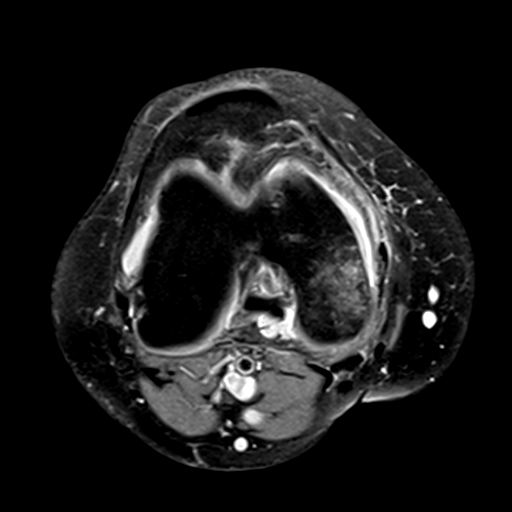
[im 22/35]
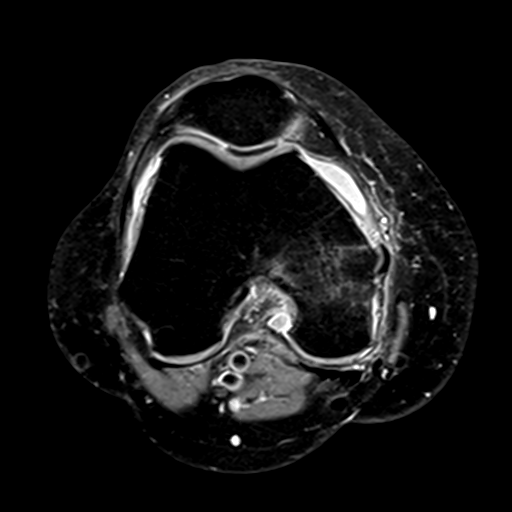
[im 26/35]
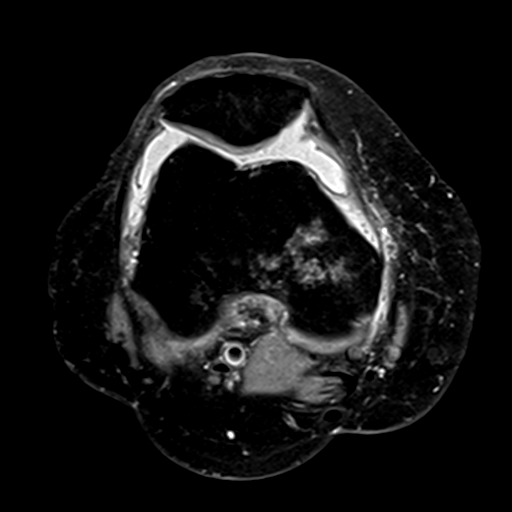
[im 30/35]
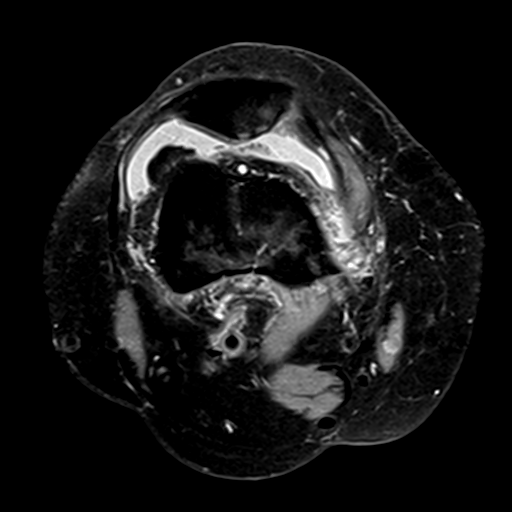
[im 35/35]
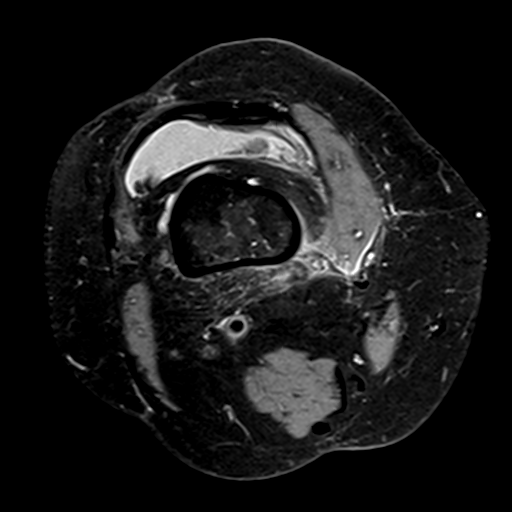

[Series 4: T1 · coronal · 3.0mm · 0.50mm/px · 3 of 32 slices shown]
[im 1/32]
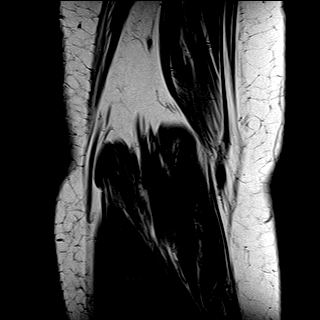
[im 6/32]
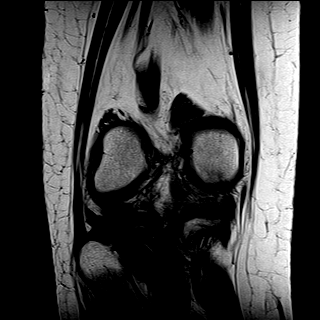
[im 11/32]
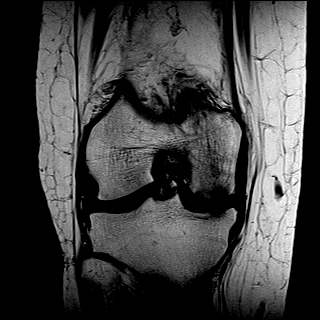

[Series 5: T2 fat-sat · coronal · 3.0mm · 0.50mm/px · 7 of 32 slices shown]
[im 1/32]
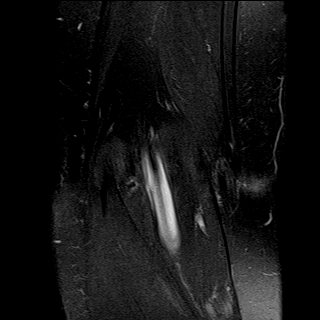
[im 6/32]
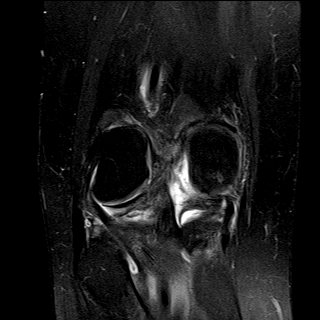
[im 11/32]
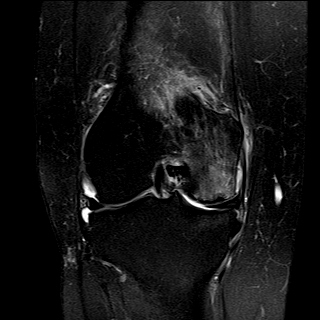
[im 16/32]
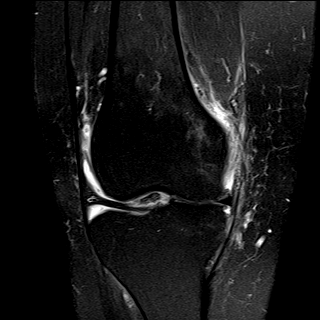
[im 21/32]
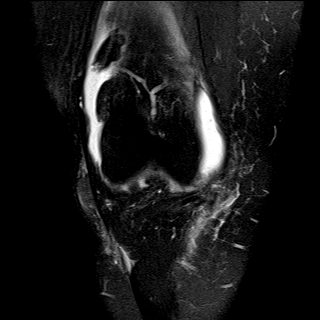
[im 26/32]
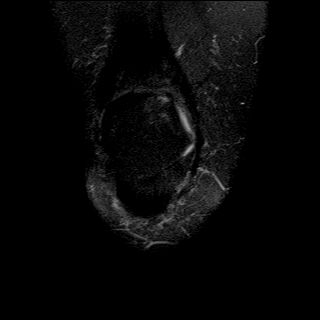
[im 32/32]
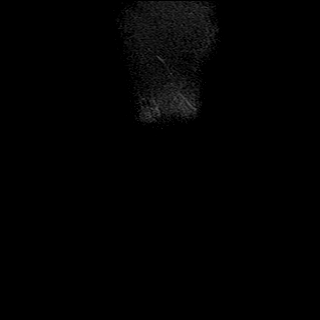

[Series 6: PD fat-sat · sagittal · 3.0mm · 0.62mm/px · 7 of 30 slices shown (2 of 4)]
[im 1/30]
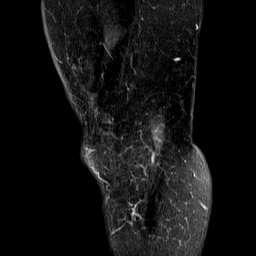
[im 5/30]
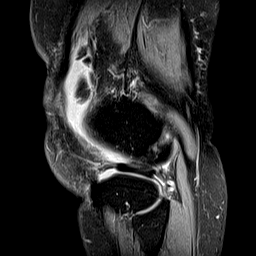
[im 10/30]
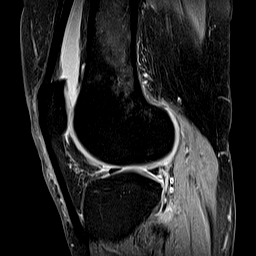
[im 15/30]
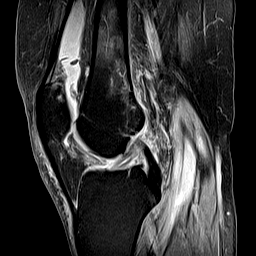
[im 20/30]
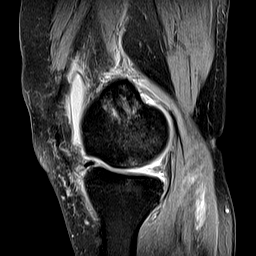
[im 25/30]
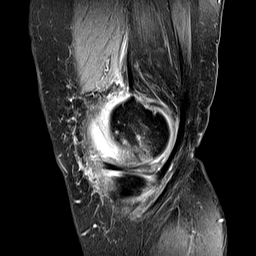
[im 30/30]
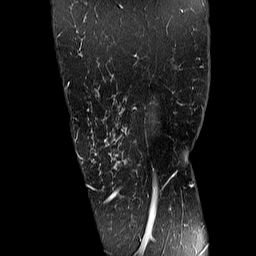

[Series 7: PD fat-sat · coronal · 3.0mm · 0.62mm/px · 7 of 32 slices shown (3 of 4)]
[im 1/32]
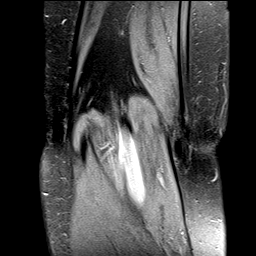
[im 6/32]
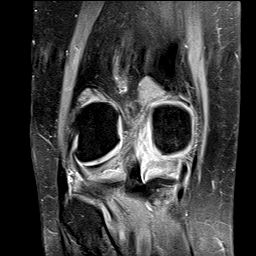
[im 11/32]
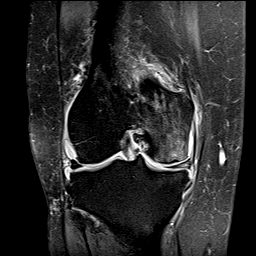
[im 16/32]
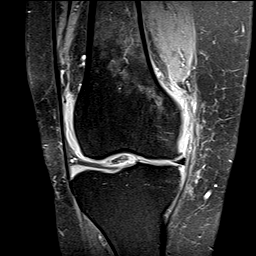
[im 21/32]
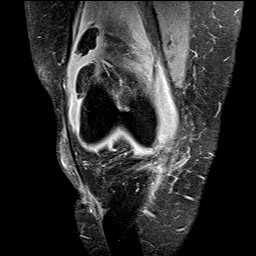
[im 26/32]
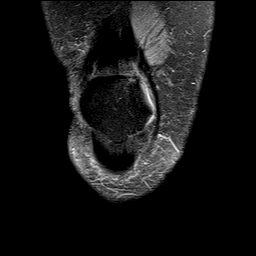
[im 32/32]
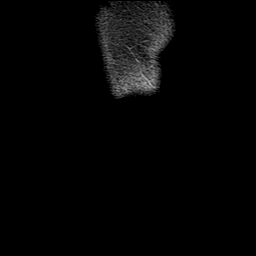

[Series 8: PD fat-sat · oblique · 2.0mm · 0.62mm/px · 3 of 11 slices shown (4 of 4)]
[im 1/11]
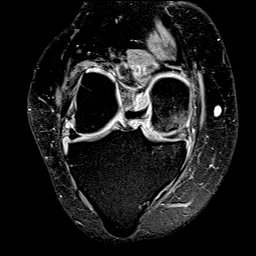
[im 6/11]
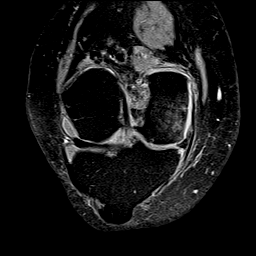
[im 11/11]
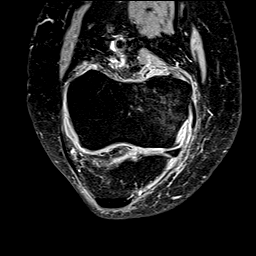

[36 of 40 positions shown; findings below may reference images not displayed]

FINDINGS: MENISCI

Medial meniscus: There is a large radial tear of the posterior horn
about 1.2 cm medial to the meniscal root. Prominent adjacent grade 1
signal in the remainder of the posterior horn extending into the
midbody. Horizontal tear of the inferior surface extends medial to
the radial tear.

Lateral meniscus:  Unremarkable

LIGAMENTS

Cruciates:  Unremarkable

Collaterals:  Edema tracks around the MCL.

CARTILAGE

Patellofemoral: Prominent chondral thinning along portions of the
medial patellar facet. Moderate chondral thinning elsewhere. Small
degenerative subcortical lesions superiorly along the medial facet.

Medial: Severe degenerative chondral thinning with marginal
spurring. Subcortical marrow edema especially in the medial femoral
condyle, along a sagittally oriented osteochondral lesion which has
some mild cortical notching but no overt fragmentation.

Lateral:  Moderate chondral thinning.

Joint:  Large knee effusion with mild synovitis.

Popliteal Fossa:  Unremarkable

Extensor Mechanism: Edema tracks around the margins of the vastus
medialis distally and along the adjacent posterior portion the
medial patellar retinaculum and medial patellofemoral ligament. The
retinaculum and medial patellofemoral ligament may be sprained.

Bones: No significant extra-articular osseous abnormalities
identified.
IMPRESSION: 1. Complex tear posterior horn medial meniscus, primary component is
a large radial tear, but with a horizontal tear extending along the
inferior meniscal surface medial to this radial tear.
2. Prominent osteoarthritis most severe along the medial patellar
facet and in the medial femoral condyle as detailed above.
3. Large knee effusion with mild synovitis.
4. Edema tracks around the distal vastus medialis, the MCL, the
medial patellofemoral ligament, and the medial patellar retinaculum.
This may reflect strain/sprain involving the structures, which do
not appear overtly torn.

## 2016-09-16 DIAGNOSIS — R7301 Impaired fasting glucose: Secondary | ICD-10-CM | POA: Diagnosis not present

## 2016-09-16 DIAGNOSIS — R1011 Right upper quadrant pain: Secondary | ICD-10-CM | POA: Diagnosis not present

## 2016-09-16 DIAGNOSIS — E785 Hyperlipidemia, unspecified: Secondary | ICD-10-CM | POA: Diagnosis not present

## 2016-09-16 DIAGNOSIS — Z79899 Other long term (current) drug therapy: Secondary | ICD-10-CM | POA: Diagnosis not present

## 2016-09-18 DIAGNOSIS — R7301 Impaired fasting glucose: Secondary | ICD-10-CM | POA: Diagnosis not present

## 2016-09-18 DIAGNOSIS — R1011 Right upper quadrant pain: Secondary | ICD-10-CM | POA: Diagnosis not present

## 2016-09-18 DIAGNOSIS — E785 Hyperlipidemia, unspecified: Secondary | ICD-10-CM | POA: Diagnosis not present

## 2016-09-18 DIAGNOSIS — K219 Gastro-esophageal reflux disease without esophagitis: Secondary | ICD-10-CM | POA: Diagnosis not present

## 2016-09-18 DIAGNOSIS — F419 Anxiety disorder, unspecified: Secondary | ICD-10-CM | POA: Diagnosis not present

## 2016-09-18 DIAGNOSIS — R197 Diarrhea, unspecified: Secondary | ICD-10-CM | POA: Diagnosis not present

## 2017-03-19 ENCOUNTER — Other Ambulatory Visit: Payer: Self-pay

## 2017-03-19 DIAGNOSIS — E785 Hyperlipidemia, unspecified: Secondary | ICD-10-CM | POA: Diagnosis not present

## 2017-03-19 DIAGNOSIS — R7301 Impaired fasting glucose: Secondary | ICD-10-CM | POA: Diagnosis not present

## 2017-09-01 ENCOUNTER — Ambulatory Visit (INDEPENDENT_AMBULATORY_CARE_PROVIDER_SITE_OTHER): Payer: Medicare Other | Admitting: Family Medicine

## 2017-09-01 ENCOUNTER — Encounter: Payer: Self-pay | Admitting: Family Medicine

## 2017-09-01 VITALS — BP 142/59 | HR 79 | Ht 66.0 in | Wt 164.0 lb

## 2017-09-01 DIAGNOSIS — M1710 Unilateral primary osteoarthritis, unspecified knee: Secondary | ICD-10-CM

## 2017-09-01 DIAGNOSIS — M170 Bilateral primary osteoarthritis of knee: Secondary | ICD-10-CM

## 2017-09-01 DIAGNOSIS — M179 Osteoarthritis of knee, unspecified: Secondary | ICD-10-CM

## 2017-09-01 DIAGNOSIS — M25561 Pain in right knee: Secondary | ICD-10-CM

## 2017-09-01 DIAGNOSIS — M25562 Pain in left knee: Principal | ICD-10-CM

## 2017-09-01 HISTORY — DX: Unilateral primary osteoarthritis, unspecified knee: M17.10

## 2017-09-01 HISTORY — DX: Osteoarthritis of knee, unspecified: M17.9

## 2017-09-01 MED ORDER — DICLOFENAC SODIUM 1 % TD GEL: 4.0000 g | Freq: Four times a day (QID) | TRANSDERMAL | 11 refills | Status: AC

## 2017-09-01 NOTE — Patient Instructions (Signed)
Thank you for coming in today. Apply the gel 4x daily as needed.  Use the compression sleeves from [employer].  Recheck in 6 weeks.  Work on [employer].    Arthritis Arthritis means joint pain. It can also mean joint disease. A joint is a place where bones come together. People who have arthritis may have:  Red joints.  Swollen joints.  Stiff joints.  Warm joints.  A fever.  A feeling of being sick.  Follow these instructions at home: Pay attention to any changes in your symptoms. Take these actions to help with your pain and swelling. Medicines  Take over-the-counter and prescription medicines only as told by your doctor.  Do not take aspirin for pain if your doctor says that you may have gout. Activity  Rest your joint if your doctor tells you to.  Avoid activities that make the pain worse.  Exercise your joint regularly as told by your doctor. Try doing exercises like: ? Swimming. ? Water aerobics. ? Biking. ? Walking. Joint Care   If your joint is swollen, keep it raised (elevated) if told by your doctor.  If your joint feels stiff in the morning, try taking a warm shower.  If you have diabetes, do not apply heat without asking your doctor.  If told, apply heat to the joint: ? Put a towel between the joint and the hot pack or heating pad. ? Leave the heat on the area for 20-30 minutes.  If told, apply ice to the joint: ? Put ice in a plastic bag. ? Place a towel between your skin and the bag. ? Leave the ice on for 20 minutes, 2-3 times per day.  Keep all follow-up visits as told by your doctor. Contact a doctor if:  The pain gets worse.  You have a fever. Get help right away if:  You have very bad pain in your joint.  You have swelling in your joint.  Your joint is red.  Many joints become painful and swollen.  You have very bad back pain.  Your leg is very weak.  You cannot control your pee (urine) or poop (stool). This  information is not intended to replace advice given to you by your health care provider. Make sure you discuss any questions you have with your health care provider. Document Released: 06/25/2009 Document Revised: 09/06/2015 Document Reviewed: 06/26/2014 Elsevier Interactive Patient Education  [employer].

## 2017-09-01 NOTE — Progress Notes (Signed)
Amy Boyer is a 72 y.o. female who presents to [facility] today for bilateral knee pain.   Amy Boyer has a several year history of knee pain in both knees. Her right has typically been worse than her left due to a twisting injury that tore her meniscus shown on MRI. She opted out of surgical intervention at that time and opted for a steroid shot. Since then, she feels her knee pain has worsened bilaterally. She often experiences swelling that causes itching and discomfort in both knees. She denies ankle or foot swelling. She states the pain is interfering with her ability to garden, which she enjoys. She denies shortness of breath.    ROS as above:  Exam:  BP (!) 142/59   Pulse 79   Ht 5\' 6"  (1.676 m)   Wt 164 lb (74.4 kg)   BMI 26.47 kg/m  Gen: Well NAD HEENT: EOMI,  MMM Lungs: Normal work of breathing. CTABL Heart: RRR no MRG Abd: NABS, Soft. Nondistended, Nontender Exts: Brisk capillary refill, warm and well perfused.   Right Knee Mild effusion, non-erythematous, no obvious deformities Crepitations palpated upon leg extension Non-tender to palpation Stable ligaments exam  Left Knee: Non-erythematous, no obvious deformities or effusions  Crepitations palpated upon leg extension Nontender to palpation Stable ligaments exam  Procedure: Real-time Ultrasound Guided Injection of Right Knee  Device: GE Logiq E   Images permanently stored and available for review in the ultrasound unit. Verbal informed consent obtained.  Discussed risks and benefits of procedure. Warned about infection bleeding damage to structures skin hypopigmentation and fat atrophy among others. Patient expresses understanding and agreement Time-out conducted.   Noted no overlying erythema, induration, or other signs of local infection.   Skin prepped in a sterile fashion.   Local  anesthesia: Topical Ethyl chloride.   With sterile technique and under real time ultrasound guidance:  40mg  kenalog and 60ml marcaine injected easily.   Completed without difficulty   Pain immediately resolved suggesting accurate placement of the medication.   Advised to call if fevers/chills, erythema, induration, drainage, or persistent bleeding.   Images permanently stored and available for review in the ultrasound unit.  Impression: Technically successful ultrasound guided injection.   Procedure: Real-time Ultrasound Guided Injection of Left knee  Device: GE Logiq E   Images permanently stored and available for review in the ultrasound unit. Verbal informed consent obtained.  Discussed risks and benefits of procedure. Warned about infection bleeding damage to structures skin hypopigmentation and fat atrophy among others. Patient expresses understanding and agreement Time-out conducted.   Noted no overlying erythema, induration, or other signs of local infection.   Skin prepped in a sterile fashion.   Local anesthesia: Topical Ethyl chloride.   With sterile technique and under real time ultrasound guidance:  40mg  kenalog and 31ml marcaine injected easily.   Completed without difficulty   Pain immediately resolved suggesting accurate placement of the medication.   Advised to call if fevers/chills, erythema, induration, drainage, or persistent bleeding.   Images permanently stored and available for review in the ultrasound unit.  Impression: Technically successful ultrasound guided injection.       Lab and Radiology Results MRI and knee x-rays images from 2017 reviewed showing moderate DJD bilateral knees.  Assessment and Plan: 72 y.o. female with   Bilateral knee osteoarthritis  Patient is hesitant and anxious about undergoing surgery. She has not exhausted her options yet to relieve  her pain. We discussed quad strengthening and exercise, topical anti-inflammatory, formal  physical therapy, and different knee injections. She will likely need knee replacements in the future but today she opted for steroid injections on both sides. We dosed 40 mg of kenalog on each side to avoid side effects of large boluses of corticosteroids.   Plan for knee injections as above, diclofenac gel, compression sleeve.  Recheck 6 weeks.   No orders of the defined types were placed in this encounter.  Meds ordered this encounter  Medications  . diclofenac sodium (VOLTAREN) 1 % GEL    Sig: Apply 4 g topically 4 (four) times daily. To affected joint.    Dispense:  500 g    Refill:  11    Patient tried and failed ibuprofen, naproxen and meloxicam. Use knee DJD     Historical information moved to improve visibility of documentation.   Social History   Tobacco Use  . Smoking status: Never Smoker  . Smokeless tobacco: Never Used  Substance Use Topics  . Alcohol use: Not on file   family history is not on file.  Medications: Current Outpatient Medications  Medication Sig Dispense Refill  . ALPRAZolam (XANAX) 0.5 MG tablet   0  . Cholecalciferol (VITAMIN D-3 PO) Take by mouth.    . diclofenac sodium (VOLTAREN) 1 % GEL Apply 4 g topically 4 (four) times daily. To affected joint. 500 g 11  . estradiol (ESTRACE) 0.5 MG tablet Take 0.5 mg by mouth daily.  0  . Omeprazole (PRILOSEC PO) Take by mouth.     No current facility-administered medications for this visit.    Allergies  Allergen Reactions  . Keflex [Cephalexin]   . Morphine And Related    Discussed warning signs or symptoms. Please see discharge instructions. Patient expresses understanding.

## 2017-10-13 ENCOUNTER — Encounter: Payer: Self-pay | Admitting: Family Medicine

## 2017-10-13 ENCOUNTER — Ambulatory Visit (INDEPENDENT_AMBULATORY_CARE_PROVIDER_SITE_OTHER): Payer: Medicare Other | Admitting: Family Medicine

## 2017-10-13 VITALS — BP 143/83 | HR 72 | Ht 66.0 in | Wt 158.0 lb

## 2017-10-13 DIAGNOSIS — G8929 Other chronic pain: Secondary | ICD-10-CM | POA: Diagnosis not present

## 2017-10-13 DIAGNOSIS — M25562 Pain in left knee: Secondary | ICD-10-CM

## 2017-10-13 DIAGNOSIS — M25561 Pain in right knee: Secondary | ICD-10-CM

## 2017-10-13 MED ORDER — NORTRIPTYLINE HCL 10 MG PO CAPS: 10.0000 mg | ORAL_CAPSULE | Freq: Every day | ORAL | 3 refills | Status: DC

## 2017-10-13 MED ORDER — CELECOXIB 200 MG PO CAPS: ORAL_CAPSULE | ORAL | 2 refills | Status: AC

## 2017-10-13 NOTE — Progress Notes (Signed)
Febe Champa is a 72 y.o. female who presents to [place] today for knee pain.  Gay Filler has been seen several times for knee pain thought to be due to moderate to severe DJD in the setting of a meniscus tear seen on MRI.  She had a trial of conservative management including home exercise program, diclofenac gel, oral ibuprofen, and steroid injection most recently on May 21.  She notes that her the steroid injection only provided relief for a few days and her pain is returned.  She would like to avoid surgery if possible and is interested in additional therapies that she could consider for her knee pain.  She does note some locking or catching but denies severe catching or locking as her main knee pain component.    ROS:  As above  Exam:  BP (!) 143/83   Pulse 72   Ht 5\' 6"  (1.676 m)   Wt 158 lb (71.7 kg)   BMI 25.50 kg/m  General: Well Developed, well nourished, and in no acute distress.  Neuro/Psych: Alert and oriented x3, extra-ocular muscles intact, able to move all 4 extremities, sensation grossly intact. Skin: Warm and dry, no rashes noted.  Respiratory: Not using accessory muscles, speaking in full sentences, trachea midline.  Cardiovascular: Pulses palpable, no extremity edema. Abdomen: Does not appear distended. MSK:  Right knee mild effusion normal motion.  Mild antalgic gait present.    Lab and Radiology Results Right knee MRI Aug 13, 2015 images reviewed IMPRESSION: 1. Complex tear posterior horn medial meniscus, primary component is a large radial tear, but with a horizontal tear extending along the inferior meniscal surface medial to this radial tear. 2. Prominent osteoarthritis most severe along the medial patellar facet and in the medial femoral condyle as detailed above. 3. Large knee effusion with mild synovitis. 4. Edema tracks around the distal vastus medialis, the MCL, the medial patellofemoral ligament, and the  medial patellar retinaculum. This may reflect strain/sprain involving the structures, which do not appear overtly torn.    Assessment and Plan: 71 y.o. female with  Knee pain bilaterally due to DJD.  Patient is failing conservative management.  Plan for trial of Visco supplementation injections.  Will authorize Orthovisc or equivalent.  Additionally will try Celebrex and low-dose nortriptyline at bedtime.  Recheck in 6 weeks.  Return sooner if needed.    No orders of the defined types were placed in this encounter.  Meds ordered this encounter  Medications  . celecoxib (CELEBREX) 200 MG capsule    Sig: One to 2 tablets by mouth daily as needed for pain.    Dispense:  60 capsule    Refill:  2  . nortriptyline (PAMELOR) 10 MG capsule    Sig: Take 1 capsule (10 mg total) by mouth at bedtime.    Dispense:  30 capsule    Refill:  3    Historical information moved to improve visibility of documentation.  Past Medical History:  Diagnosis Date  . Acid reflux 06/13/2015  . Anxiety 06/13/2015  . 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 HLD (hyperlipidemia) 06/13/2015   History reviewed. No pertinent surgical history. Social History   Tobacco Use  . Smoking status: Never Smoker  . Smokeless tobacco: Never Used  Substance Use Topics  . Alcohol use: Not on file   family history is not on file.  Medications: Current Outpatient Medications  Medication Sig Dispense Refill  . ALPRAZolam (XANAX) 0.5 MG  tablet   0  . Cholecalciferol (VITAMIN D-3 PO) Take by mouth.    . diclofenac sodium (VOLTAREN) 1 % GEL Apply 4 g topically 4 (four) times daily. To affected joint. 500 g 11  . estradiol (ESTRACE) 0.5 MG tablet Take 0.5 mg by mouth daily.  0  . Omeprazole (PRILOSEC PO) Take by mouth.    Marland Kitchen PARoxetine (PAXIL) 20 MG tablet     . celecoxib (CELEBREX) 200 MG capsule One to 2 tablets by mouth daily as needed for pain. 60 capsule 2  . nortriptyline (PAMELOR) 10 MG capsule Take 1  capsule (10 mg total) by mouth at bedtime. 30 capsule 3   No current facility-administered medications for this visit.    Allergies  Allergen Reactions  . Keflex [Cephalexin]   . Morphine And Related       Discussed warning signs or symptoms. Please see discharge instructions. Patient expresses understanding.  CC: Martinique, Julie Marie, NP  [address]  [place], [place] [address]  [phone]  [phone] (Fax)

## 2017-10-13 NOTE — Patient Instructions (Signed)
Thank you for coming in today. STOP ibuprofen START celebrex 2x daily.  Take nortriptyline at bedtime You should hear about gel shots.  Recheck with me in 6 weeks or sooner if needed  Sodium Hyaluronate intra-articular injection What is this medicine? SODIUM HYALURONATE (SOE dee um hye al yoor ON ate) is used to treat pain in the knee due to osteoarthritis. This medicine may be used for other purposes; ask your health care provider or pharmacist if you have questions. COMMON BRAND NAME(S): Amvisc, DUROLANE, Euflexxa, GELSYN-3, Hyalgan, Monovisc, Orthovisc, Supartz, Supartz FX What should I tell my health care provider before I take this medicine? They need to know if you have any of these conditions: -bleeding disorders -glaucoma -infection in the knee joint -skin conditions or sensitivity -skin infection -an unusual allergic reaction to sodium hyaluronate, other medicines, foods, dyes, or preservatives. Different brands of sodium hyaluronate contain different allergens. Some may contain egg. Talk to your doctor about your allergies to make sure that you get the right product. -pregnant or trying to get pregnant -breast-feeding How should I use this medicine? This medicine is for injection into the knee joint. It is given by a health care professional in a hospital or clinic setting. Talk to your pediatrician regarding the use of this medicine in children. Special care may be needed. Overdosage: If you think you have taken too much of this medicine contact a poison control center or emergency room at once. NOTE: This medicine is only for you. Do not share this medicine with others. What if I miss a dose? This does not apply. What may interact with this medicine? Interactions are not expected. This list may not describe all possible interactions. Give your health care provider a list of all the medicines, herbs, non-prescription drugs, or dietary supplements you use. Also tell them if  you smoke, drink alcohol, or use illegal drugs. Some items may interact with your medicine. What should I watch for while using this medicine? Tell your doctor or healthcare professional if your symptoms do not start to get better or if they get worse. If receiving this medicine for osteoarthritis, limit your activity after you receive your injection. Avoid physical activity for 48 hours following your injection to keep your knee from swelling. Do not stand on your feet for more than 1 hour at a time during the first 48 hours following your injection. Ask your doctor or healthcare professional about when you can begin major physical activity again. What side effects may I notice from receiving this medicine? Side effects that you should report to your doctor or health care professional as soon as possible: -allergic reactions like skin rash, itching or hives, swelling of the face, lips, or tongue -dizziness -facial flushing -pain, tingling, numbness in the hands or feet -vision changes if received this medicine during eye surgery Side effects that usually do not require medical attention (report to your doctor or health care professional if they continue or are bothersome): -back pain -bruising at site where injected -chills -diarrhea -fever -headache -joint pain -joint stiffness -joint swelling -muscle cramps -muscle pain -nausea, vomiting -pain, redness, or irritation at site where injected -weak or tired This list may not describe all possible side effects. Call your doctor for medical advice about side effects. You may report side effects to FDA at 1-800-FDA-1088. Where should I keep my medicine? This drug is given in a hospital or clinic and will not be stored at home. NOTE: This sheet is a summary.  It may not cover all possible information. If you have questions about this medicine, talk to your doctor, pharmacist, or health care provider.  2018 Elsevier/Gold Standard (2015-05-03  08:34:51)

## 2017-10-14 ENCOUNTER — Telehealth: Payer: Self-pay | Admitting: Family Medicine

## 2017-10-14 NOTE — Telephone Encounter (Signed)
-----   Message from Tasia Catchings, [place] sent at 10/14/2017  8:58 AM EDT ----- Regarding: FW: orthovisc Information has been submitted to [facility] and awaiting determination.   ----- Message ----- From: Gregor Hams, MD Sent: 10/13/2017  11:38 AM To: Tasia Catchings, CMA Subject: orthovisc                                      Please prior auth [facility] for right knee DJD

## 2017-10-19 NOTE — Telephone Encounter (Signed)
Orthovisc is covered. The deductible has been met, the patient's responsibility is 20% of the allowable amount. According to Medicare Guideline: There is no copay. The out of pocket does not apply. No PA is required. No PCP referral is required. Procurement is not allowed through pharmacy option. Benefits verification were retrieved through the portal Office Action: No additional steps needed for the clinic   Patient will call back if she wants to go forward with the injections.

## 2017-11-24 ENCOUNTER — Encounter: Payer: Self-pay | Admitting: Family Medicine

## 2017-11-24 ENCOUNTER — Ambulatory Visit (INDEPENDENT_AMBULATORY_CARE_PROVIDER_SITE_OTHER): Payer: Medicare Other | Admitting: Family Medicine

## 2017-11-24 VITALS — BP 150/44 | HR 95 | Temp 97.5°F | Wt 163.5 lb

## 2017-11-24 DIAGNOSIS — R030 Elevated blood-pressure reading, without diagnosis of hypertension: Secondary | ICD-10-CM

## 2017-11-24 DIAGNOSIS — M25562 Pain in left knee: Secondary | ICD-10-CM | POA: Diagnosis not present

## 2017-11-24 DIAGNOSIS — M25561 Pain in right knee: Secondary | ICD-10-CM

## 2017-11-24 DIAGNOSIS — G8929 Other chronic pain: Secondary | ICD-10-CM

## 2017-11-24 MED ORDER — NORTRIPTYLINE HCL 10 MG PO CAPS: 10.0000 mg | ORAL_CAPSULE | Freq: Every day | ORAL | 3 refills | Status: AC

## 2017-11-24 NOTE — Progress Notes (Signed)
Amy Boyer is a 72 y.o. female who presents to [facility] today for knee pain.  Amy Boyer was seen in July for knee pain bilaterally.  She was prescribed Celebrex and nortriptyline after failing a trial of steroid injections.  She notes the amitriptyline is been extremely helpful.  She denies of not side effects such as severe dry mouth or next day fatigue or confusion.  She uses Celebrex now only intermittently.  Last visit we are also getting the process started for hyaluronic acid injections which patient has deferred as her knee pain is improving.  Patient is a blood pressures typically well managed at other doctor's offices.  No chest pain palpitations or shortness of breath.    ROS:  As above  Exam:  BP (!) 150/44   Pulse 95   Temp (!) 97.5 F (36.4 C) (Oral)   Wt 163 lb 8 oz (74.2 kg)   BMI 26.39 kg/m  General: Well Developed, well nourished, and in no acute distress.  Neuro/Psych: Alert and oriented x3, extra-ocular muscles intact, able to move all 4 extremities, sensation grossly intact. Skin: Warm and dry, no rashes noted.  Respiratory: Not using accessory muscles, speaking in full sentences, trachea midline.  Cardiovascular: Pulses palpable, no extremity edema. Abdomen: Does not appear distended. MSK:  Knees bilaterally with normal motion.  Normal gait.  Patient is able stand from a seated position.      Assessment and Plan: 72 y.o. female with  Knee pain: Improved bilaterally with nortriptyline at bedtime.  Plan to continue nortriptyline indefinitely and use Celebrex as needed.  I have prescribed a 90-day prescription with 3 refills of nortriptyline however PCP can take over this prescribing in the future if needed.  Amy Boyer PCP can take over prescribing Celebrex in the future if needed as well.  As patient is improving I do not see a role for proceeding with further injections at this time.  If pain worsens or changes patient  can proceed with hyaluronic acid injection or repeat steroid injection in the future.  Patient will let me know.  Otherwise no firm follow-up scheduled.  Elevated blood pressure: Elevated today.  Follow-up with PCP for this issue.  Suspect today's blood pressure is more of an outlier than typical.  I spent 15 minutes with this patient, greater than 50% was face-to-face time counseling regarding ddx and plan.   Historical information moved to improve visibility of documentation.  Past Medical History:  Diagnosis Date  . Acid reflux 06/13/2015  . Anxiety 06/13/2015  . 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 HLD (hyperlipidemia) 06/13/2015   No past surgical history on file. Social History   Tobacco Use  . Smoking status: Never Smoker  . Smokeless tobacco: Never Used  Substance Use Topics  . Alcohol use: Not on file   family history is not on file.  Medications: Current Outpatient Medications  Medication Sig Dispense Refill  . ALPRAZolam (XANAX) 0.5 MG tablet   0  . celecoxib (CELEBREX) 200 MG capsule One to 2 tablets by mouth daily as needed for pain. 60 capsule 2  . Cholecalciferol (VITAMIN D-3 PO) Take by mouth.    . diclofenac sodium (VOLTAREN) 1 % GEL Apply 4 g topically 4 (four) times daily. To affected joint. 500 g 11  . estradiol (ESTRACE) 0.5 MG tablet Take 0.5 mg by mouth daily.  0  . nortriptyline (PAMELOR) 10 MG capsule Take 1 capsule (10 mg total) by mouth  at bedtime. 30 capsule 3  . Omeprazole (PRILOSEC PO) Take by mouth.    Marland Kitchen PARoxetine (PAXIL) 30 MG tablet Take 30 mg by mouth daily.     No current facility-administered medications for this visit.    Allergies  Allergen Reactions  . Keflex [Cephalexin]   . Morphine And Related       Discussed warning signs or symptoms. Please see discharge instructions. Patient expresses understanding.

## 2017-11-24 NOTE — Patient Instructions (Signed)
Thank you for coming in today. Continue nortriptyline at night.  Use celebrex as needed.  Schedule injections as needed.   Recheck with me as needed.

## 2017-12-15 ENCOUNTER — Other Ambulatory Visit: Payer: Self-pay | Admitting: Family Medicine
